# Patient Record
Sex: Female | Born: 1960 | Race: White | Hispanic: No | Marital: Single | Smoking: Former smoker
Health system: Southern US, Community
[De-identification: ages and names within clinical notes are randomized; demographics above are authoritative.]

## PROBLEM LIST (undated history)

## (undated) DIAGNOSIS — E119 Type 2 diabetes mellitus without complications: Secondary | ICD-10-CM

## (undated) HISTORY — DX: Type 2 diabetes mellitus without complications: E11.9

## (undated) HISTORY — PX: ABDOMINAL HYSTERECTOMY: SHX81

---
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
## 1991-07-08 HISTORY — PX: CHOLECYSTECTOMY: SHX55

## 1997-07-07 HISTORY — PX: MINOR HEMORRHOIDECTOMY: SHX6238

## 2004-10-31 ENCOUNTER — Ambulatory Visit: Payer: Self-pay | Admitting: Family Medicine

## 2005-03-24 ENCOUNTER — Emergency Department: Payer: Self-pay | Admitting: Emergency Medicine

## 2007-03-31 ENCOUNTER — Ambulatory Visit: Payer: Self-pay

## 2008-07-07 HISTORY — PX: BREAST BIOPSY: SHX20

## 2008-12-20 ENCOUNTER — Ambulatory Visit: Payer: Self-pay

## 2008-12-27 ENCOUNTER — Ambulatory Visit: Payer: Self-pay

## 2009-01-24 ENCOUNTER — Ambulatory Visit: Payer: Self-pay | Admitting: General Surgery

## 2009-02-01 ENCOUNTER — Ambulatory Visit: Payer: Self-pay | Admitting: General Surgery

## 2010-11-08 ENCOUNTER — Emergency Department: Payer: Self-pay | Admitting: Emergency Medicine

## 2011-02-25 IMAGING — US ULTRASOUND LEFT BREAST
1 series · 18 of 25 positions shown · non-contrast
Comparison: none

REASON FOR EXAM: left parenchymal density
COMMENTS:

PROCEDURE:     US  - US LT BREAST ([REDACTED])  - December 27, 2008  [DATE]
RESULT:       Previously identified density in the upper portion of the left
breast is not a simple cyst.  Therefore, mammographic needle localization
and surgical removal suggested.

[Series 1: ultrasound left breast · 18 of 29 slices shown]
[im 1/29]
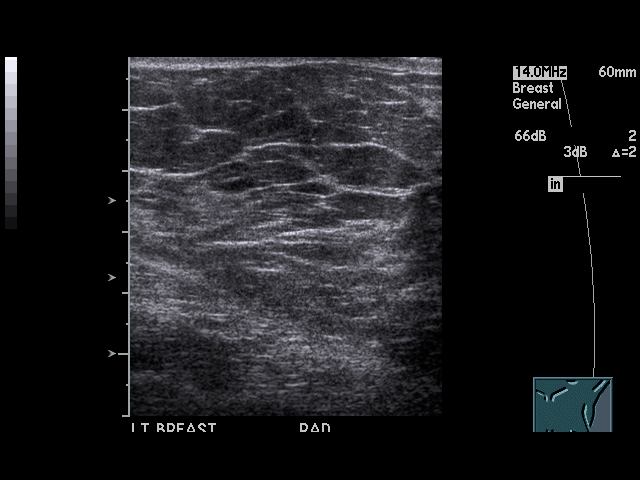
[im 3/29]
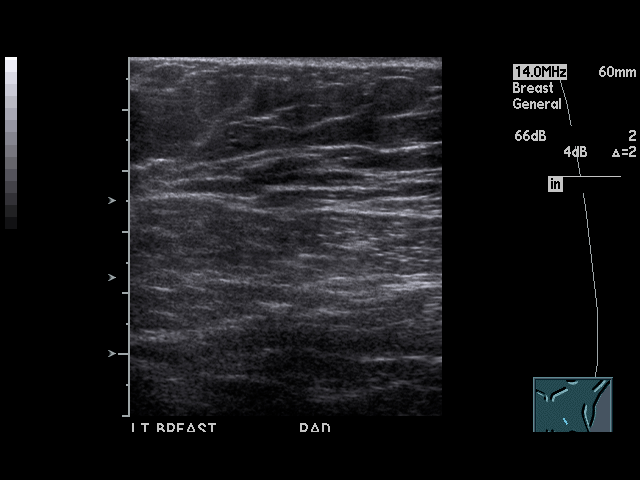
[im 4/29]
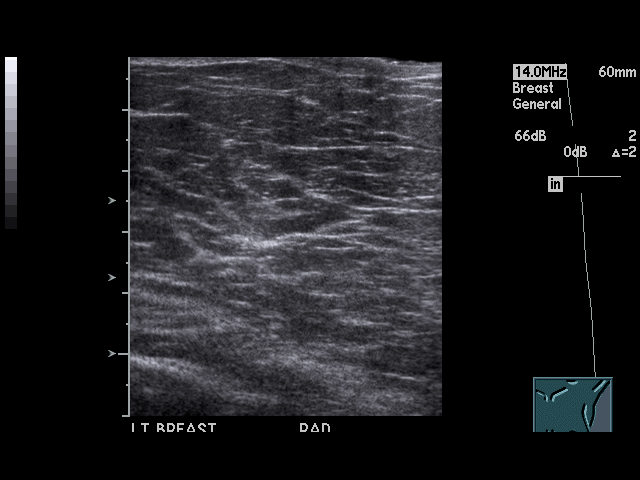
[im 5/29]
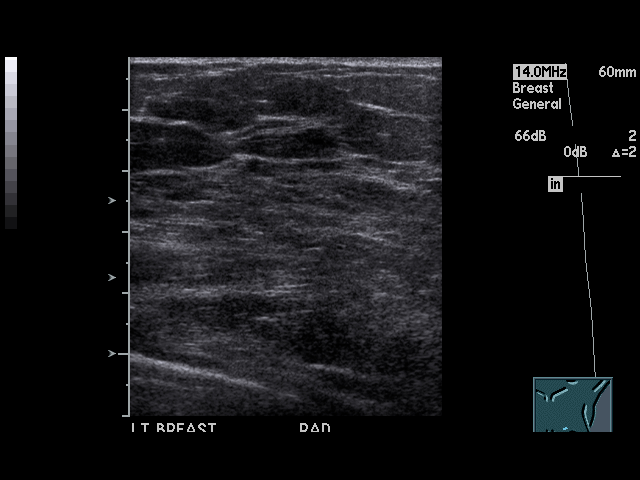
[im 8/29]
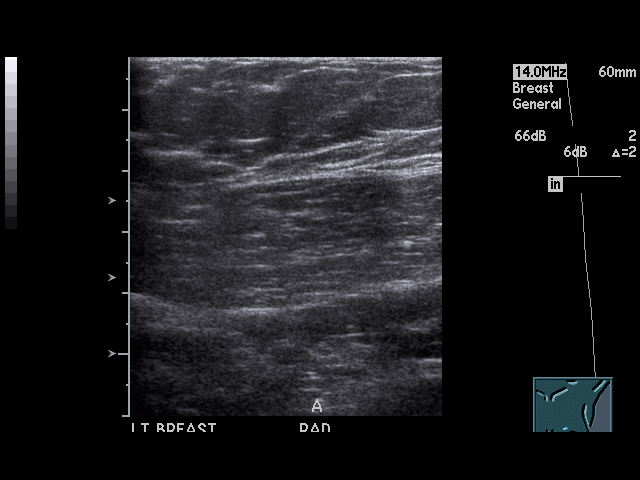
[im 9/29]
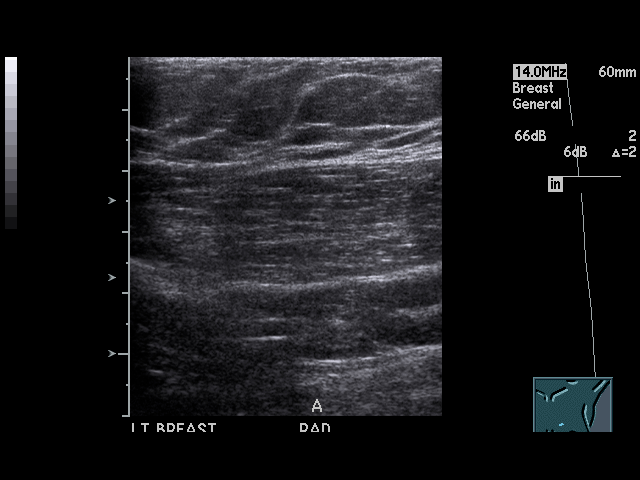
[im 11/29]
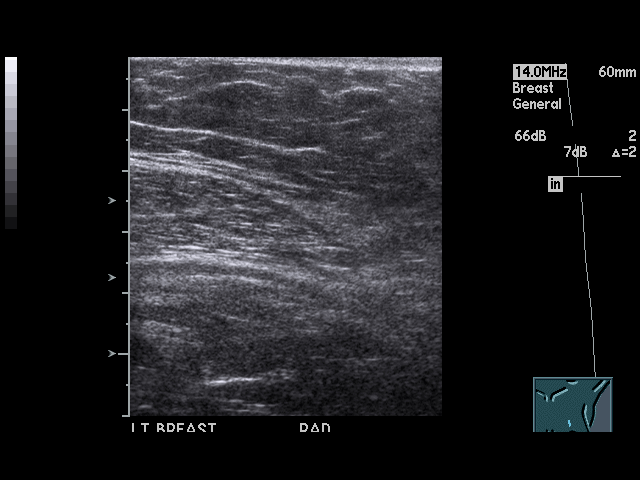
[im 12/29]
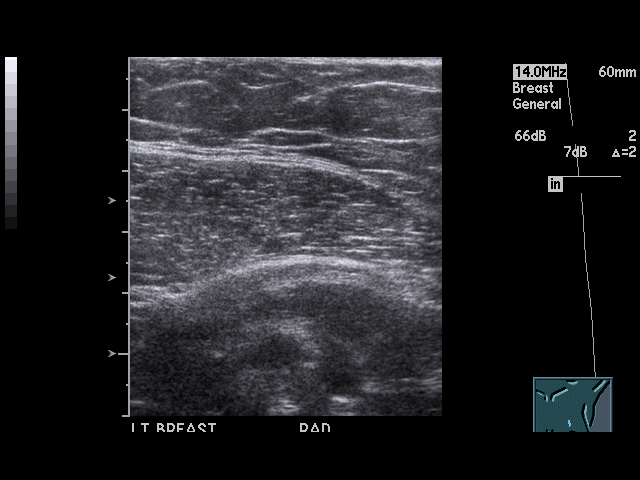
[im 13/29]
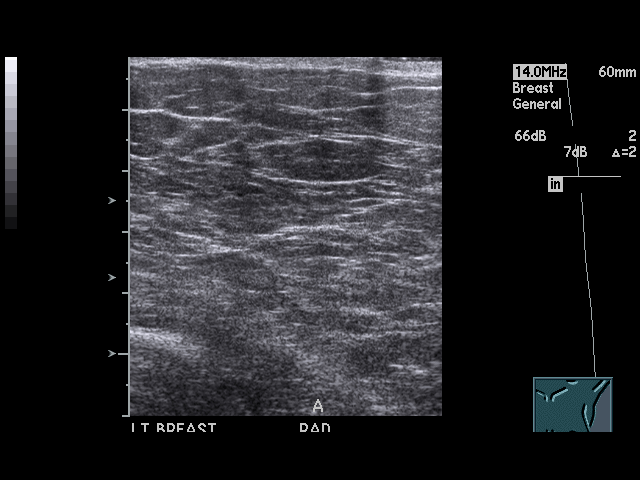
[im 16/29]
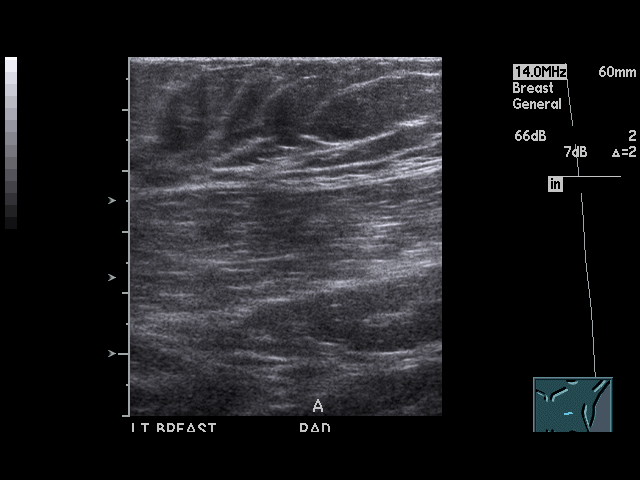
[im 17/29]
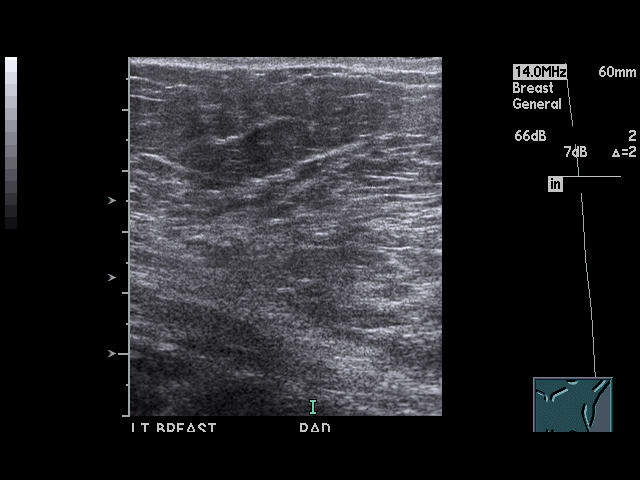
[im 18/29]
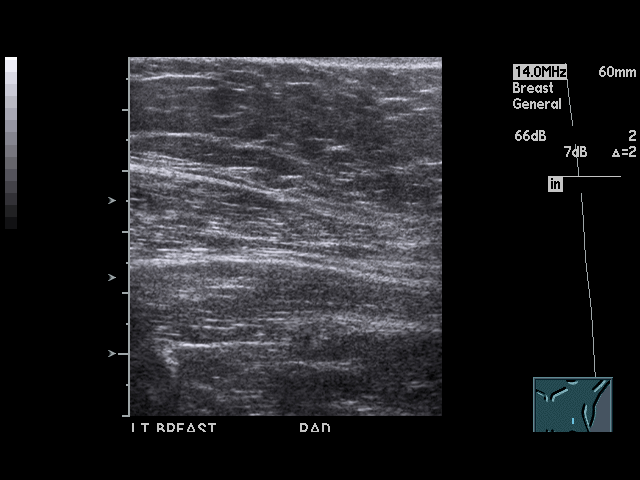
[im 20/29]
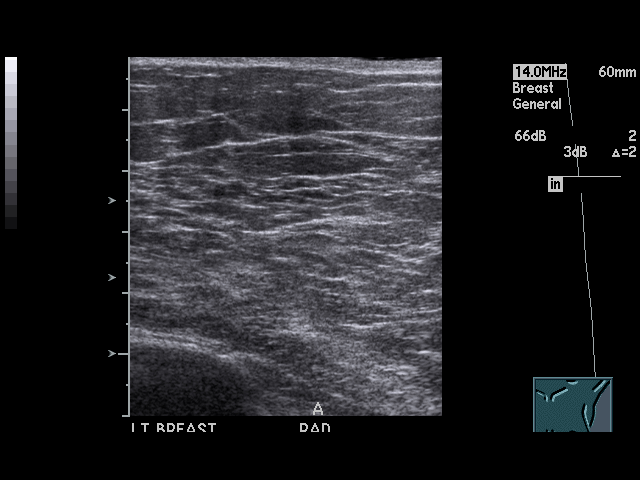
[im 22/29]
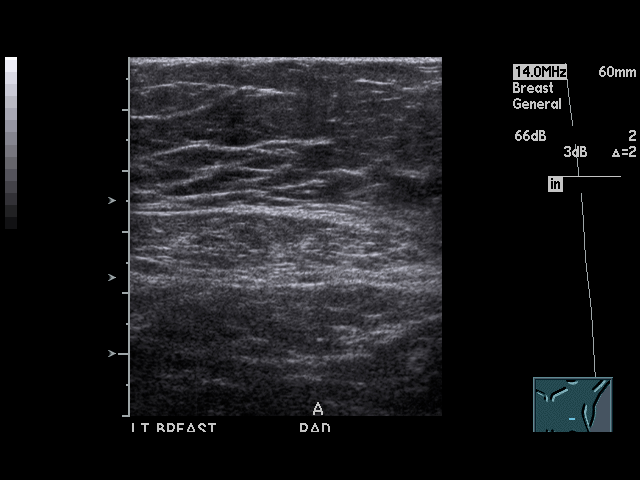
[im 24/29]
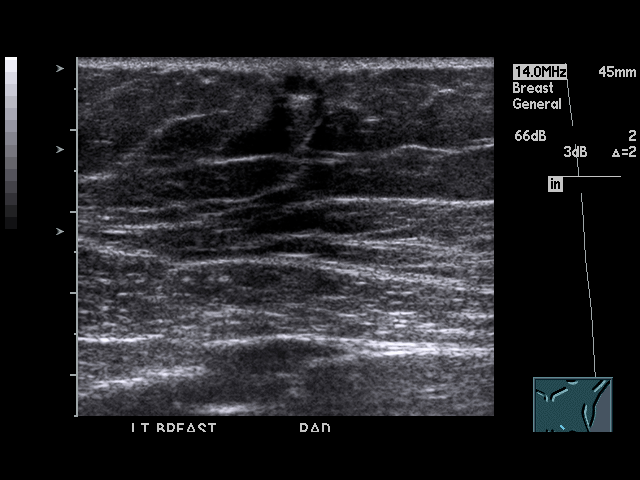
[im 25/29]
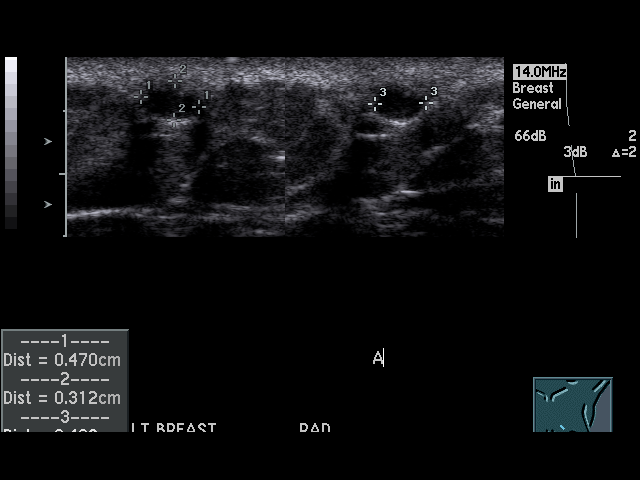
[im 26/29]
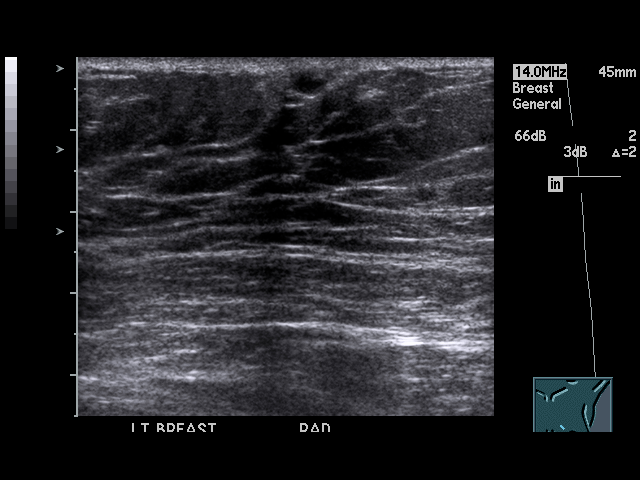
[im 29/29]
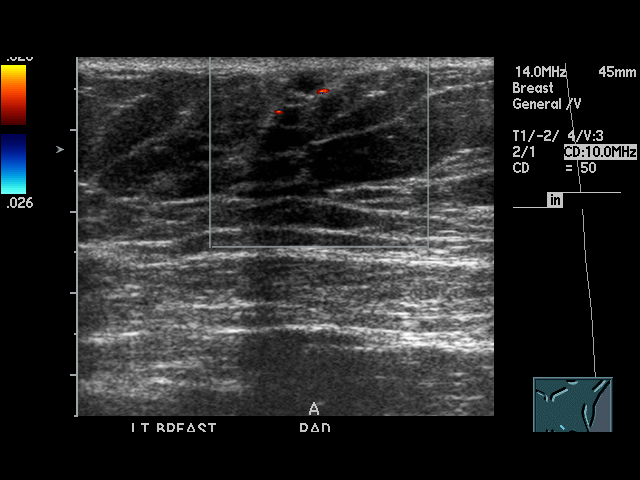

[18 of 25 positions shown; findings below may reference images not displayed]

IMPRESSION: BI-RADS:  Category 4- Suspicious Abnormality.

## 2011-05-15 ENCOUNTER — Emergency Department: Payer: Self-pay | Admitting: *Deleted

## 2012-05-25 ENCOUNTER — Ambulatory Visit: Payer: Self-pay

## 2017-03-02 ENCOUNTER — Encounter: Payer: Self-pay | Admitting: *Deleted

## 2017-03-02 ENCOUNTER — Ambulatory Visit
Admission: RE | Admit: 2017-03-02 | Discharge: 2017-03-02 | Disposition: A | Discharge status: Home / Self Care | Payer: Self-pay | Source: Ambulatory Visit | Attending: Oncology | Admitting: Oncology

## 2017-03-02 ENCOUNTER — Ambulatory Visit: Payer: Self-pay | Attending: Oncology | Admitting: *Deleted

## 2017-03-02 VITALS — BP 160/95 | Temp 98.1°F | Ht 64.0 in | Wt 203.0 lb

## 2017-03-02 DIAGNOSIS — N63 Unspecified lump in unspecified breast: Secondary | ICD-10-CM

## 2017-03-02 NOTE — Patient Instructions (Signed)
Gave patient hand-out, Women Staying Healthy, Active and Well from [facility], with education on breast health, pap smears, heart and colon health. 

## 2017-03-02 NOTE — Progress Notes (Signed)
Subjective:     Patient ID: Monica Stone, female   DOB: [birth year], 56 y.o.   MRN: [id number]  HPI   Review of Systems     Objective:   Physical Exam  Pulmonary/Chest:         Assessment:     56 year old White female presents to [facility] with complaints of a left breast abscess.  States she has a history of an abscess at almost the same site.  States it has been present for 2 weeks.  States it was bigger, but it has started draining in the past week.  On clinical breast exam there is an approximate 4X6 cm red hard mass at 11 to 12:00.  Taught self breast awareness.  Patient has a history of bilateral breast reduction in 1989 at the age of 89.  Family history of breast cancer, liver cancer and melanoma in her mother.  Patient has been screened for eligibility.  She does not have any insurance, Medicare or Medicaid.  She also meets financial eligibility.  Hand-out given on the Affordable Care Act.    Plan:     Patient saw Dr. Evette Cristal in the past for her last abscess.  Joellyn Quails to schedule her to return to see him and schedule her for a bilateral diagnostic mammogram and ultrasound.

## 2017-03-05 ENCOUNTER — Ambulatory Visit: Payer: Self-pay | Admitting: General Surgery

## 2017-03-25 ENCOUNTER — Telehealth: Payer: Self-pay | Admitting: *Deleted

## 2017-03-25 NOTE — Telephone Encounter (Signed)
Patient had cancelled appointment with Dr. Evette Cristal.  Called patient to discuss follow-up of her breast abscess.  States she took the antibiotics the radiologist gave her and cancelled her appointment with Dr. Evette Cristal, because she didn't think she needed it, and would save [place] some money.  States the abscess is still draining, "but is better."  I have rescheduled her to see Dr. Evette Cristal on Monday 03/30/17 @ 11:00.  She is to take a photo ID and all her meds.  Will follow-up per [facility] protocol and Dr. Evette Cristal.

## 2017-03-26 ENCOUNTER — Telehealth: Payer: Self-pay

## 2017-03-26 NOTE — Telephone Encounter (Signed)
Spoke with Dr Evette Cristal about this and he says she should dress the area but that she will be alright to wait until Monday, 03/16/23/18, to be seen.  Patient notified of such and she is comfortable with waiting until Monday. She will call back if she has any further concerns.

## 2017-03-26 NOTE — Telephone Encounter (Signed)
Patient is scheduled to be seen on 03/30/17 for a breast abscess. She states that the area has been draining some off and on for the past few days. She did complete a round of antibiotics last week and the area seemed to get better but did continue to have drainage. She had a very large amount of puss drainage last night and now she has an opening in her breast and she is concerned if she is ok to wait to be seen until Monday. She denies and pain, redness, or fever. Just concerned about the hole.

## 2017-03-30 ENCOUNTER — Ambulatory Visit (INDEPENDENT_AMBULATORY_CARE_PROVIDER_SITE_OTHER): Payer: PRIVATE HEALTH INSURANCE | Admitting: General Surgery

## 2017-03-30 ENCOUNTER — Encounter: Payer: Self-pay | Admitting: General Surgery

## 2017-03-30 VITALS — BP 130/72 | HR 72 | Resp 14 | Ht 61.0 in | Wt 204.0 lb

## 2017-03-30 DIAGNOSIS — L089 Local infection of the skin and subcutaneous tissue, unspecified: Secondary | ICD-10-CM

## 2017-03-30 DIAGNOSIS — L729 Follicular cyst of the skin and subcutaneous tissue, unspecified: Secondary | ICD-10-CM

## 2017-03-30 NOTE — Progress Notes (Signed)
Patient ID: Monica Stone, female   DOB: 1960-11-01, 56 y.o.   MRN: [id number]  Chief Complaint  Patient presents with  . Other    HPI Monica Stone is a 56 y.o. female here today for a left breast abscess. Patient noticed this area about a month ago. Mammogram was on 03/02/2017. She reports it drained on its own and she was given an antibiotic and the area is much better now.  She had a prior benign left breast biopsy in 2010. Family history of breast cancer (mother).   HPI  Past Medical History:  Diagnosis Date  . Diabetes mellitus without complication [facility])     Past Surgical History:  Procedure Laterality Date  . ABDOMINAL HYSTERECTOMY    . BREAST BIOPSY Left 2010   done by Alyssamarie Mounsey  . CESAREAN SECTION    . CHOLECYSTECTOMY  1993  . MINOR HEMORRHOIDECTOMY  1999  . REDUCTION MAMMAPLASTY Bilateral 1983    Family History  Problem Relation Age of Onset  . Breast cancer Mother 63    Social History Social History  Substance Use Topics  . Smoking status: Former Games developer  . Smokeless tobacco: Never Used  . Alcohol use No    No Known Allergies  No current outpatient prescriptions on file.   No current facility-administered medications for this visit.     Review of Systems Review of Systems  Constitutional: Negative.   Respiratory: Negative.   Cardiovascular: Negative.     Blood pressure 130/72, pulse 72, resp. rate 14, height  (1.549 m), weight 204 lb (92.5 kg), last menstrual period 03/03/2011.  Physical Exam Physical Exam  Constitutional: She is oriented to person, place, and time. She appears well-developed and well-nourished.  Pulmonary/Chest: Right breast exhibits no inverted nipple, no mass, no nipple discharge, no skin change and no tenderness. Left breast exhibits no inverted nipple, no mass, no nipple discharge, no skin change and no tenderness.    Lymphadenopathy:    She has no axillary adenopathy.  Neurological: She is alert and oriented to  person, place, and time.  Skin: Skin is warm and dry.    Data Reviewed Prior notes reviewed  Assessment    Infected skin cyst of left breast - healing well with no signs of infection.     Plan    Continue wound care and return in 1 month for follow up.     HPI, Physical Exam, Assessment and Plan have been scribed under the direction and in the presence of Kathreen Cosier, MD  Ples Specter, CMA    [facility] G 03/30/2017, 11:33 AM  I have completed the exam and reviewed the above documentation for accuracy and completeness.  I agree with the above.  Museum/gallery conservator has been used and any errors in dictation or transcription are unintentional.  Markise Haymer G. Evette Cristal, M.D., F.A.C.S.

## 2017-03-30 NOTE — Patient Instructions (Signed)
Return in 1 month for follow up.

## 2017-04-01 ENCOUNTER — Ambulatory Visit: Payer: Self-pay | Attending: Oncology

## 2017-04-02 ENCOUNTER — Encounter: Payer: Self-pay | Admitting: General Surgery

## 2017-04-15 ENCOUNTER — Encounter: Payer: Self-pay | Admitting: *Deleted

## 2017-04-15 NOTE — Progress Notes (Signed)
Patient has been seen by Dr. Evette Cristal.  Her breast abscess is healing.  She is to follow up with him in one month.  [facility] to [place].

## 2017-04-27 ENCOUNTER — Ambulatory Visit: Payer: Self-pay | Admitting: General Surgery

## 2017-05-21 ENCOUNTER — Encounter: Payer: Self-pay | Admitting: *Deleted

## 2017-06-22 ENCOUNTER — Ambulatory Visit (INDEPENDENT_AMBULATORY_CARE_PROVIDER_SITE_OTHER): Payer: Self-pay | Admitting: General Surgery

## 2017-06-22 ENCOUNTER — Encounter: Payer: Self-pay | Admitting: General Surgery

## 2017-06-22 VITALS — BP 160/82 | HR 78 | Resp 14 | Ht 64.0 in | Wt 203.0 lb

## 2017-06-22 DIAGNOSIS — L089 Local infection of the skin and subcutaneous tissue, unspecified: Secondary | ICD-10-CM

## 2017-06-22 DIAGNOSIS — L729 Follicular cyst of the skin and subcutaneous tissue, unspecified: Secondary | ICD-10-CM

## 2017-06-22 MED ORDER — DOXYCYCLINE HYCLATE 100 MG PO TABS: 100.0000 mg | ORAL_TABLET | Freq: Two times a day (BID) | ORAL | 0 refills | Status: AC

## 2017-06-22 NOTE — Progress Notes (Signed)
Patient ID: Nicki ReaperDeana D Nop, female   DOB: 01/29/1961, 56 y.o.   MRN: [id number]  Chief Complaint  Patient presents with  . Follow-up    HPI Fernando D Freddrick MarchCaddell is a 56 y.o. female here today for her one month follow skin cyst. Patient states the area still with drainage sometimes.  HPI  Past Medical History:  Diagnosis Date  . Diabetes mellitus without complication [facility](HCC)     Past Surgical History:  Procedure Laterality Date  . ABDOMINAL HYSTERECTOMY    . BREAST BIOPSY Left 2010   done by Dajanique Robley  . CESAREAN SECTION    . CHOLECYSTECTOMY  1993  . MINOR HEMORRHOIDECTOMY  1999  . REDUCTION MAMMAPLASTY Bilateral 1983    Family History  Problem Relation Age of Onset  . Breast cancer Mother 3450    Social History Social History   Tobacco Use  . Smoking status: Former Games developermoker  . Smokeless tobacco: Never Used  Substance Use Topics  . Alcohol use: No  . Drug use: No    No Known Allergies  Current Outpatient Medications  Medication Sig Dispense Refill  . doxycycline (VIBRA-TABS) 100 MG tablet Take 1 tablet (100 mg total) by mouth 2 (two) times daily. 14 tablet 0   No current facility-administered medications for this visit.     Review of Systems Review of Systems  Constitutional: Negative.   Respiratory: Negative.   Cardiovascular: Negative.     Blood pressure (!) 160/82, pulse 78, resp. rate 14, height 5\' 4"  (1.626 m), weight 203 lb (92.1 kg), last menstrual period 03/03/2011.  Physical Exam Physical Exam  Constitutional: She is oriented to person, place, and time. She appears well-nourished.  Eyes: Conjunctivae are normal.  Pulmonary/Chest:    Neurological: She is alert and oriented to person, place, and time.  Skin: Skin is warm and dry.   This scabbed area was probed with a Q-tip and a pocket entered few drops of pus drained out Data Reviewed  [place] notes reviewed  Assessment    Infected skin cyst of the left breast.  Still with evidence of some pus-this  has been fully drained now with the use of Q-tip. Rx with doxycycline 100 mg twice daily for about a week    Plan       Patient to return as needed.Rx sent to Pharmacy.  The patient is aware to call back for any questions or concerns.  HPI, Physical Exam, Assessment and Plan have been scribed under the direction and in the presence of Kathreen CosierS. G. Jacquelynn Friend, MD  Ples SpecterJessica Qualls, CMA  I have completed the exam and reviewed the above documentation for accuracy and completeness.  I agree with the above.  Museum/gallery conservatorDragon Technology has been used and any errors in dictation or transcription are unintentional.  Sydnee Lamour G. Evette CristalSankar, M.D., F.A.C.S.   Gerlene BurdockSANKAR,Endia Moncur G 06/22/2017, 2:55 PM

## 2017-06-22 NOTE — Patient Instructions (Signed)
Patient to return as needed.Rx sent to Pharmacy.  The patient is aware to call back for any questions or concerns.

## 2017-06-26 ENCOUNTER — Telehealth: Payer: Self-pay | Admitting: *Deleted

## 2017-06-26 MED ORDER — FLUCONAZOLE 100 MG PO TABS: 100.0000 mg | ORAL_TABLET | Freq: Every day | ORAL | 0 refills | Status: AC

## 2017-06-26 NOTE — Telephone Encounter (Signed)
Patient called in this morning and states she had a bad yeast infection call her Diflucan in. She is taking doxycyline too.

## 2019-05-01 IMAGING — MG MM DIGITAL DIAGNOSTIC BILAT W/ TOMO W/ CAD
8 of 17 series · 8 of 40 positions shown · non-contrast
Comparison: Previous exam(s).

ACR Breast Density Category a: The breast tissue is almost entirely
fatty.

CLINICAL DATA: Patient presents with an inflamed, raised,
erythematous and draining masslike area over the upper left breast,
noted for 2 weeks. Disc drainage has been more recent, with a
decrease in the size of the lesion. She has history of 2 other areas
of infection different body locations, 1 on the face. She also is a
history of surgery in the upper left breast reportedly reviewing
benign scarring.

EXAM:
2D DIGITAL DIAGNOSTIC BILATERAL MAMMOGRAM WITH CAD AND ADJUNCT TOMO
ULTRASOUND LEFT BREAST

[R CC synth-2D]
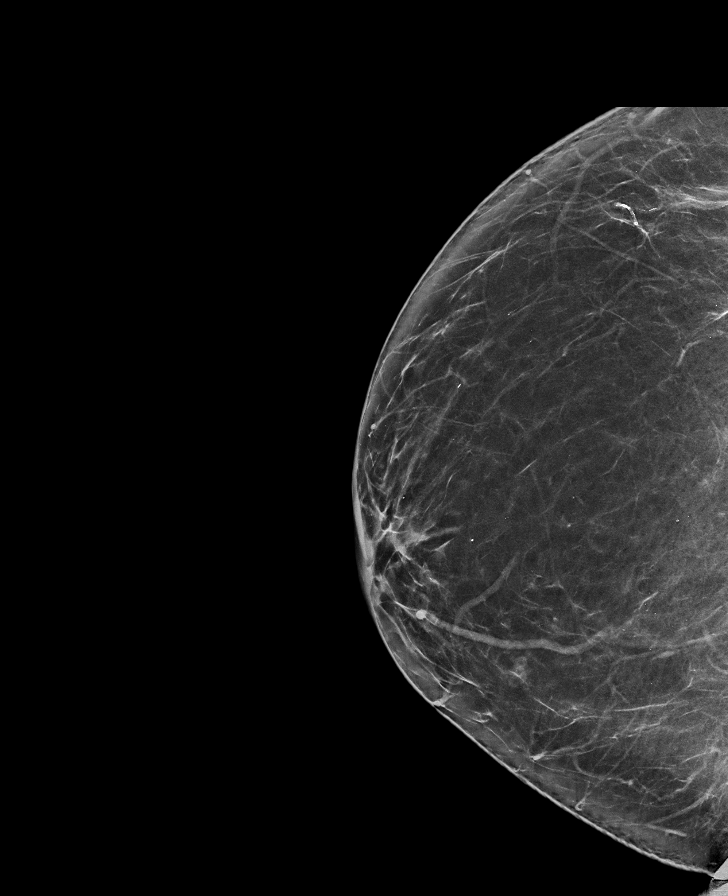

[R CC]
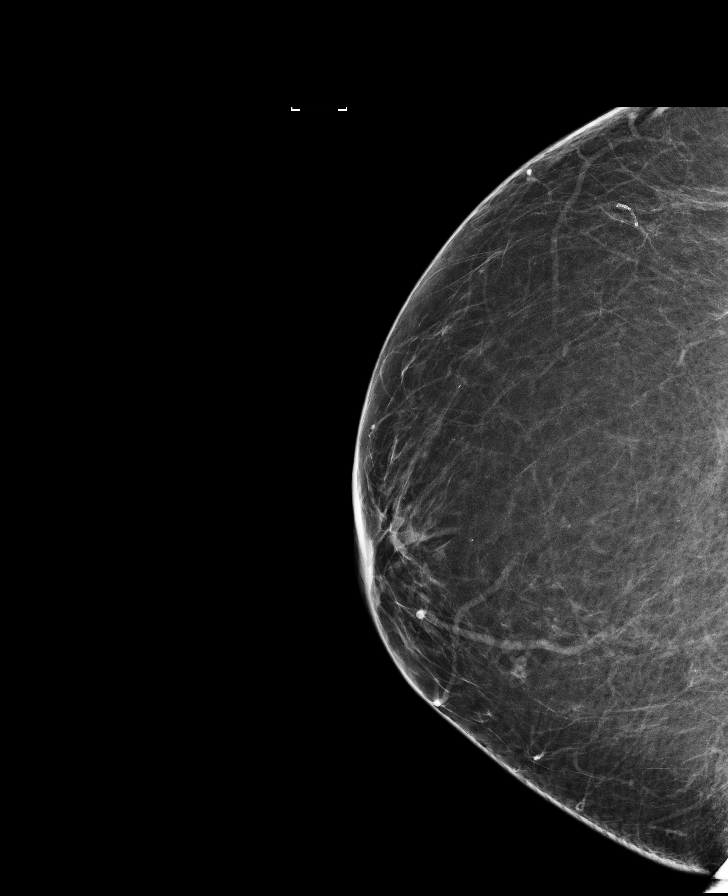

[L MLO synth-2D]
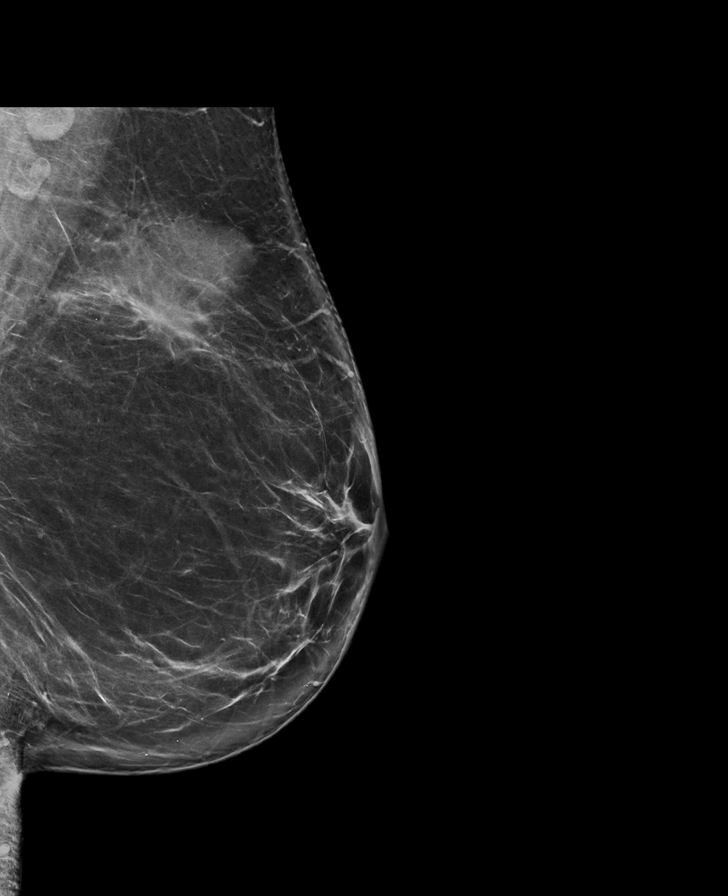

[L MLO]
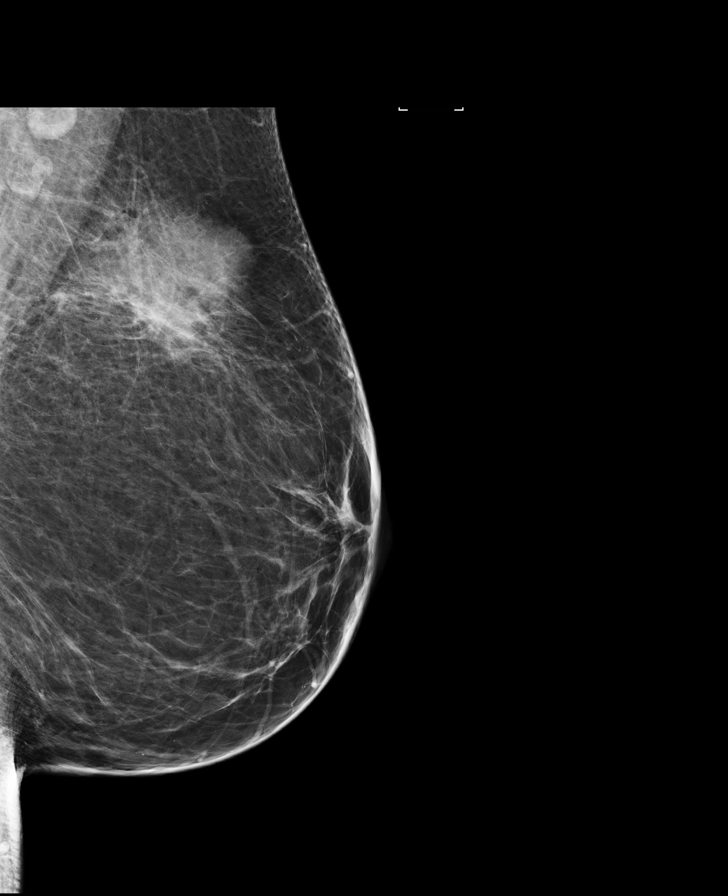

[L TAN]
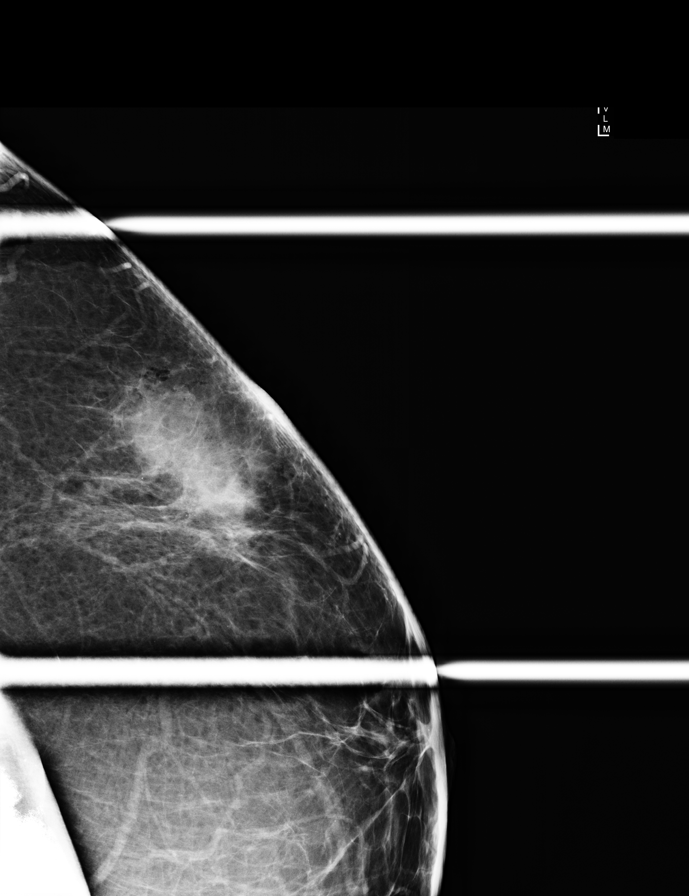

[R MLO synth-2D]
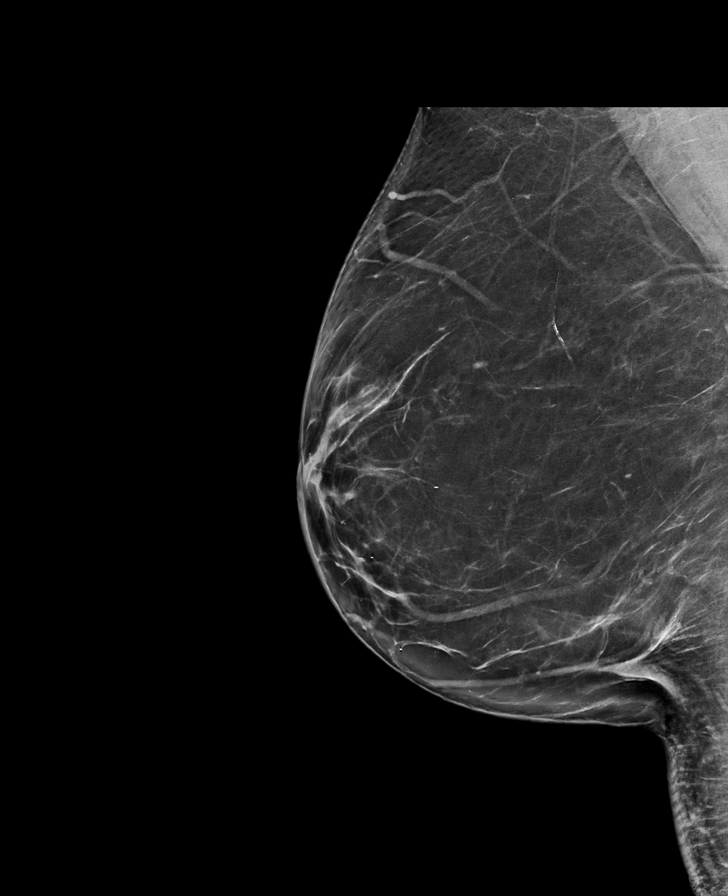

[L CC]
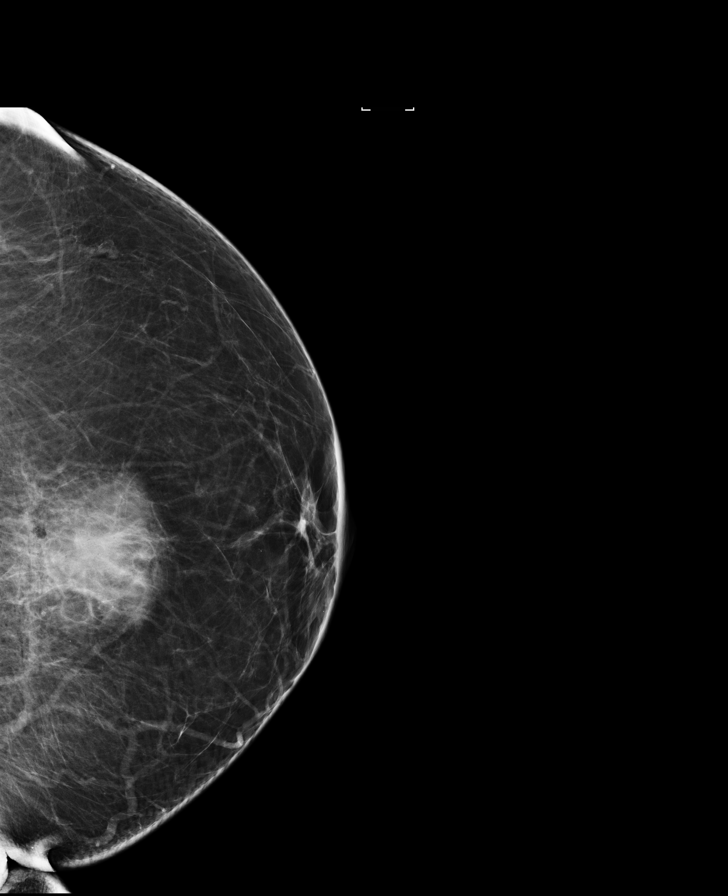

[R MLO]
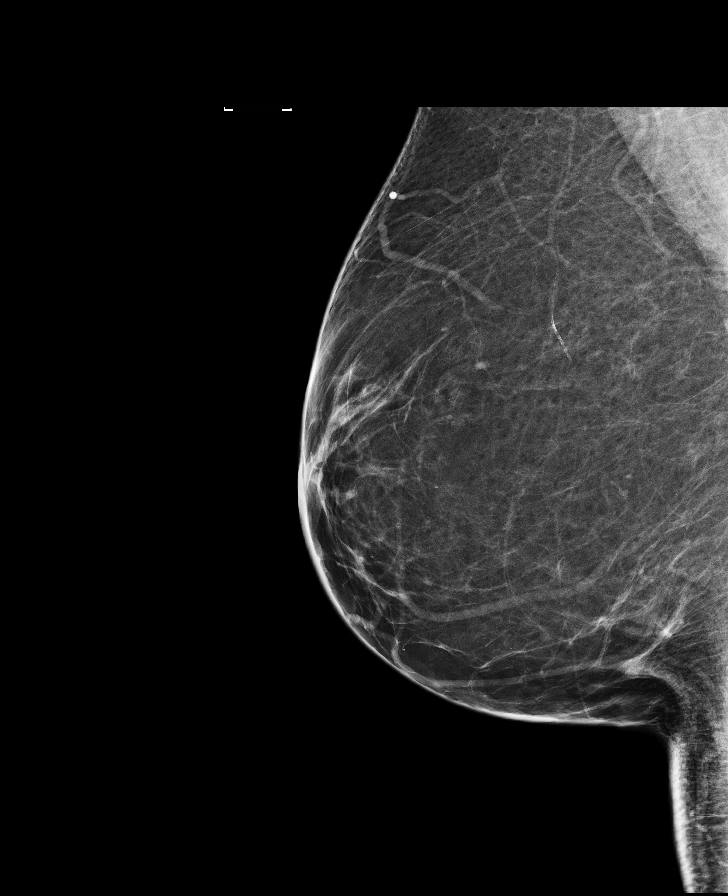

[8 of 40 positions shown; findings below may reference images not displayed]

FINDINGS: In the upper left breast, slightly medial to midline, there is a
focal area of density with ill-defined margins, as well as internal
mixed attenuation. This is new since the prior study, with the most
recent prior exam dated 05/25/2012. This lies in the location of the
previous surgery.

There are no discrete masses or other areas of new density. There
are no areas of architectural distortion. There are no suspicious
calcifications. No other mammographic change.

Mammographic images were processed with CAD.

On physical exam, there is a raised erythematous tender lesion of
the upper inner left breast skin with an underlying tender
ballotable mass.

Targeted ultrasound is performed, showing an abscess corresponding
to the palpable abnormality and mammographic finding in the left
breast at 11 o'clock, 8 cm from the nipple. The hypoechoic
heterogeneous fluid collection measures 4.2 x 1.9 x 4.6 cm. There is
surrounding hyperechoic skin and fat reflecting inflammation.
IMPRESSION: 1. No evidence of breast malignancy.
2. Left breast abscess.

RECOMMENDATION:
1. Currently the abscess has been spontaneously draining and, based
on history, has decreased in size. The patient was offered
antibiotic therapy as well as aspiration of the abscess under
ultrasound guidance versus follow-up with her surgeon, with whom she
has an appointment on [REDACTED], 03/05/2017. The patient chose not to
have aspiration at this time. She was placed on Augmentin, 875 mg, 1
p.o. b.i.d., for 10 days. She will follow-up with Dr. Mike on
03/05/2017.
2. Recommend follow-up left breast diagnostic mammography and
ultrasound after completion of treatment and symptoms have resolved
for reassessment of the area of infection.
3.  Screening mammogram in one year.(Code:[id number])

I have discussed the findings and recommendations with the patient.
Results were also provided in writing at the conclusion of the
visit. If applicable, a reminder letter will be sent to the patient
regarding the next appointment.

BI-RADS CATEGORY  2: Benign.

## 2019-12-07 ENCOUNTER — Ambulatory Visit
Admission: RE | Admit: 2019-12-07 | Discharge: 2019-12-07 | Disposition: A | Discharge status: Home / Self Care | Payer: Self-pay | Source: Ambulatory Visit | Attending: Oncology | Admitting: Oncology

## 2019-12-07 ENCOUNTER — Ambulatory Visit: Payer: Self-pay | Attending: Oncology | Admitting: *Deleted

## 2019-12-07 ENCOUNTER — Other Ambulatory Visit: Payer: Self-pay

## 2019-12-07 ENCOUNTER — Encounter: Payer: Self-pay | Admitting: *Deleted

## 2019-12-07 VITALS — BP 148/93 | HR 61 | Temp 97.8°F | Ht 61.0 in | Wt 203.9 lb

## 2019-12-07 DIAGNOSIS — Z0000 Encounter for general adult medical examination without abnormal findings: Secondary | ICD-10-CM

## 2019-12-07 NOTE — Patient Instructions (Signed)
Gave patient hand-out, Women Staying Healthy, Active and Well from [facility], with education on breast health, pap smears, heart and colon health. 

## 2019-12-07 NOTE — Progress Notes (Signed)
  Subjective:     Patient ID: SHAVONTA GOSSEN, female   DOB: 19-Sep-1960, 59 y.o.   MRN: [id number]  HPI   Review of Systems     Objective:   Physical Exam Chest:     Breasts:        Right: No swelling, bleeding, inverted nipple, mass, nipple discharge, skin change or tenderness.        Left: No swelling, bleeding, inverted nipple, mass, nipple discharge, skin change or tenderness.    Lymphadenopathy:     Upper Body:     Right upper body: No supraclavicular or axillary adenopathy.     Left upper body: No supraclavicular or axillary adenopathy.        Assessment:     59 year old female returns to [facility] for annual screening.  Clinical breast exam reveals bilateral breast to have scarring from previous breast reduction.  Taught self breat awareness.  Family history of breast cancer, liver cancer and melanoma in her mother.  Patient has a history of a total hysterectomy for "ovarian cysts".  Pap omitted per protocol.  Patient has been screened for eligibility.  She does not have any insurance, Medicare or Medicaid.  She also meets financial eligibility.  Patient with a Dondra Spry model risk assessment of 18.3% Lifetime risk of breast cancer.  Discussed importance of annual screening. Risk Assessment    Risk Scores      12/07/2019   Last edited by: Alta Corning, CMA   5-year risk: 3 %   Lifetime risk: 18.3 %            Plan:     Screening mammogram ordered.  Will follow up per [facility] protocol.

## 2019-12-15 ENCOUNTER — Encounter: Payer: Self-pay | Admitting: *Deleted

## 2019-12-15 NOTE — Progress Notes (Signed)
Letter mailed from the [facility] to inform patient of her normal mammogram results.  Patient is to follow-up with annual screening in one year. 

## 2020-02-21 ENCOUNTER — Other Ambulatory Visit: Payer: Self-pay

## 2022-04-28 ENCOUNTER — Other Ambulatory Visit: Payer: Self-pay

## 2022-04-28 DIAGNOSIS — Z1231 Encounter for screening mammogram for malignant neoplasm of breast: Secondary | ICD-10-CM

## 2022-04-29 ENCOUNTER — Ambulatory Visit: Payer: Self-pay | Attending: Hematology and Oncology | Admitting: *Deleted

## 2022-04-29 ENCOUNTER — Encounter: Payer: Self-pay | Admitting: *Deleted

## 2022-04-29 ENCOUNTER — Ambulatory Visit
Admission: RE | Admit: 2022-04-29 | Discharge: 2022-04-29 | Disposition: A | Discharge status: Home / Self Care | Payer: Self-pay | Source: Ambulatory Visit | Attending: Obstetrics and Gynecology | Admitting: Obstetrics and Gynecology

## 2022-04-29 ENCOUNTER — Other Ambulatory Visit: Payer: Self-pay

## 2022-04-29 VITALS — BP 178/77 | Wt 202.4 lb

## 2022-04-29 DIAGNOSIS — Z01419 Encounter for gynecological examination (general) (routine) without abnormal findings: Secondary | ICD-10-CM

## 2022-04-29 DIAGNOSIS — Z1231 Encounter for screening mammogram for malignant neoplasm of breast: Secondary | ICD-10-CM | POA: Insufficient documentation

## 2022-04-29 DIAGNOSIS — Z1211 Encounter for screening for malignant neoplasm of colon: Secondary | ICD-10-CM

## 2022-04-29 NOTE — Progress Notes (Signed)
Ms. Monica Stone is a 61 y.o. female who presents to [facility] clinic today with no complaints. Patient presents for clinical breast exam and mammogram.  Patient with a history of a total hysterectomy in 2012.    Pap Smear: Pap not smear completed today. Last Pap smear was completed prior to her hysterectomy.  Last Pap smear result is not available in Epic.   Physical exam: Breasts Breasts symmetrical. No skin abnormalities bilateral breasts. Bilateral breast have scars from previous breast reduction surgery.  No nipple retraction bilateral breasts. No nipple discharge bilateral breasts. No lymphadenopathy. No lumps palpated bilateral breasts.       Pelvic/Bimanual Pap is not indicated today    Smoking History: Patient has is a former smoker    Patient Navigation: Patient education provided.  Taught self breast awareness.  Patient does not have a PCP.  States she is diabetic, but does not take any meds currently.  Also complains of "phantom smells".  Denies headaches, dizziness, visual changes or any neurological issues other than her "diabetic neuropathy".  Number to the [facility] given for the patient to establish a PCP.  States she cannot afford the [employer] for a PCP.  Encouraged to go to the ED or Urgent Care if smells worsen, or if she has any additional symptoms as listed above.  She is agreeable.  Access to services provided for patient through [facility] program. No interpreter provided. No transportation provided   Colorectal Cancer Screening: Per patient has not had a colonoscopy.  FIT test given today with instructions.   No complaints today.    Breast and Cervical Cancer Risk Assessment: Patient has family history of breast cancer, no known genetic mutations, or radiation treatment to the chest before age 46. Patient does not have history of cervical dysplasia, immunocompromised, or DES exposure in-utero.  Risk Assessment   No risk assessment data for the current  encounter  Risk Scores       12/07/2019   Last edited by: Orson Slick, CMA   5-year risk: 3 %   Lifetime risk: 18.3 %           Risk Assessment     Risk Scores       04/29/2022 12/07/2019   Last edited by: Demetrius Revel, LPN [place], Sequoyah G, CMA   5-year risk: 3.3 % 3 %   Lifetime risk: 17.2 % 18.3 %            A: [facility] exam without pap smear Complaint of "phantom smells"  P: Referred patient to the [facility] for a screening mammogram. Appointment scheduled for today.  Will follow up per [facility] protocol.  Rico Junker, RN 04/29/2022 10:57 AM

## 2022-05-03 LAB — FECAL OCCULT BLOOD, IMMUNOCHEMICAL: Fecal Occult Bld: NEGATIVE

## 2022-05-07 ENCOUNTER — Other Ambulatory Visit: Payer: Self-pay

## 2022-05-07 ENCOUNTER — Telehealth: Payer: Self-pay

## 2022-05-07 NOTE — Progress Notes (Signed)
Error

## 2022-05-07 NOTE — Telephone Encounter (Signed)
Patient informed negative FIT test results, verbalized understanding.  

## 2022-05-23 NOTE — Progress Notes (Signed)
Error
